# Patient Record
Sex: Male | Born: 1990 | Race: White | Hispanic: No | Marital: Single
Health system: Southern US, Community
[De-identification: ages and names within clinical notes are randomized; demographics above are authoritative.]

## PROBLEM LIST (undated history)

## (undated) DIAGNOSIS — J45909 Unspecified asthma, uncomplicated: Secondary | ICD-10-CM

## (undated) HISTORY — DX: Unspecified asthma, uncomplicated: J45.909

---

## 2021-05-31 ENCOUNTER — Emergency Department (HOSPITAL_COMMUNITY): Payer: Self-pay

## 2021-05-31 ENCOUNTER — Emergency Department (HOSPITAL_COMMUNITY)
Admission: EM | Admit: 2021-05-31 | Discharge: 2021-06-01 | Disposition: A | Discharge status: Home / Self Care | Payer: Self-pay | Attending: Student | Admitting: Student

## 2021-05-31 ENCOUNTER — Other Ambulatory Visit: Payer: Self-pay

## 2021-05-31 DIAGNOSIS — N5089 Other specified disorders of the male genital organs: Secondary | ICD-10-CM | POA: Insufficient documentation

## 2021-05-31 DIAGNOSIS — L03818 Cellulitis of other sites: Secondary | ICD-10-CM

## 2021-05-31 DIAGNOSIS — I861 Scrotal varices: Secondary | ICD-10-CM

## 2021-05-31 LAB — CBC WITH DIFFERENTIAL/PLATELET
Abs Immature Granulocytes: 0.05 10*3/uL (ref 0.00–0.07)
Basophils Absolute: 0.1 10*3/uL (ref 0.0–0.1)
Basophils Relative: 1 %
Eosinophils Absolute: 0.2 10*3/uL (ref 0.0–0.5)
Eosinophils Relative: 2 %
HCT: 44.9 % (ref 39.0–52.0)
Hemoglobin: 15.3 g/dL (ref 13.0–17.0)
Immature Granulocytes: 0 %
Lymphocytes Relative: 14 %
Lymphs Abs: 1.7 10*3/uL (ref 0.7–4.0)
MCH: 30 pg (ref 26.0–34.0)
MCHC: 34.1 g/dL (ref 30.0–36.0)
MCV: 88 fL (ref 80.0–100.0)
Monocytes Absolute: 1 10*3/uL (ref 0.1–1.0)
Monocytes Relative: 9 %
Neutro Abs: 9 10*3/uL — ABNORMAL HIGH (ref 1.7–7.7)
Neutrophils Relative %: 74 %
Platelets: 257 10*3/uL (ref 150–400)
RBC: 5.1 MIL/uL (ref 4.22–5.81)
RDW: 12.8 % (ref 11.5–15.5)
WBC: 12 10*3/uL — ABNORMAL HIGH (ref 4.0–10.5)
nRBC: 0 % (ref 0.0–0.2)

## 2021-05-31 LAB — URINALYSIS, ROUTINE W REFLEX MICROSCOPIC
Bilirubin Urine: NEGATIVE
Glucose, UA: NEGATIVE mg/dL
Hgb urine dipstick: NEGATIVE
Ketones, ur: NEGATIVE mg/dL
Leukocytes,Ua: NEGATIVE
Nitrite: NEGATIVE
Protein, ur: NEGATIVE mg/dL
Specific Gravity, Urine: 1.024 (ref 1.005–1.030)
pH: 5 (ref 5.0–8.0)

## 2021-05-31 LAB — COMPREHENSIVE METABOLIC PANEL
ALT: 78 U/L — ABNORMAL HIGH (ref 0–44)
AST: 45 U/L — ABNORMAL HIGH (ref 15–41)
Albumin: 4.6 g/dL (ref 3.5–5.0)
Alkaline Phosphatase: 56 U/L (ref 38–126)
Anion gap: 8 (ref 5–15)
BUN: 15 mg/dL (ref 6–20)
CO2: 25 mmol/L (ref 22–32)
Calcium: 9.3 mg/dL (ref 8.9–10.3)
Chloride: 101 mmol/L (ref 98–111)
Creatinine, Ser: 0.97 mg/dL (ref 0.61–1.24)
GFR, Estimated: >60 mL/min (ref >60)
Glucose, Bld: 106 mg/dL — ABNORMAL HIGH (ref 70–99)
Potassium: 3.6 mmol/L (ref 3.5–5.1)
Sodium: 134 mmol/L — ABNORMAL LOW (ref 135–145)
Total Bilirubin: 0.7 mg/dL (ref 0.3–1.2)
Total Protein: 7.8 g/dL (ref 6.5–8.1)

## 2021-05-31 LAB — LACTIC ACID, PLASMA: Lactic Acid, Venous: 0.8 mmol/L (ref 0.5–1.9)

## 2021-05-31 MED ORDER — ACETAMINOPHEN 500 MG PO TABS
1000.0000 mg | ORAL_TABLET | Freq: Once | ORAL | Status: AC
Start: 2021-05-31 — End: 2021-05-31
  Administered 2021-05-31: 1000 mg via ORAL
  Filled 2021-05-31: qty 2

## 2021-05-31 NOTE — ED Triage Notes (Signed)
Pt reports testicular swelling, progressing in size and pain throughout the day. Started out as what he thought was a bug bite and is now "like a third ball." Denies dysuria or drainage. Pt in drug rehab and does not want to take narcotics.

## 2021-05-31 NOTE — ED Provider Triage Note (Signed)
Emergency Medicine Provider Triage Evaluation Note  Jeff Crawford , a 31 y.o. male  was evaluated in triage.  Pt complains of scrotal swelling.  Noticed this earlier today, progressively worsening throughout the day.  States he has had a little bit of trouble urinating.  He denies any dysuria or penile discharge.  Has not been sexually active recently.  No trauma to the groin.  Review of Systems  Positive: Scrotal swelling Negative: Penile discharge  Physical Exam  BP (!) 146/94 (BP Location: Right Arm)    Pulse (!) 119    Temp (!) 100.5 F (38.1 C) (Oral)    Resp 18    Ht 5\' 10"  (1.778 m)    Wt 102.1 kg    SpO2 98%    BMI 32.28 kg/m  Gen:   Awake, no distress   Resp:  Normal effort  MSK:   Moves extremities without difficulty  Other:  Genital exam deferred in triage  Medical Decision Making  Medically screening exam initiated at 10:43 PM.  Appropriate orders placed.  Velta Addison was informed that the remainder of the evaluation will be completed by another provider, this initial triage assessment does not replace that evaluation, and the importance of remaining in the ED until their evaluation is complete.  Scrotal swelling.  Febrile and tachycardic in triage.  Will obtain labs, lactate, scrotal US w/doppler.   Larene Pickett, PA-C 05/31/21 2246

## 2021-06-01 MED ORDER — CEFADROXIL 500 MG PO CAPS
500.0000 mg | ORAL_CAPSULE | Freq: Two times a day (BID) | ORAL | Status: DC
  Administered 2021-06-01: 500 mg via ORAL
  Filled 2021-06-01: qty 1

## 2021-06-01 MED ORDER — NAPROXEN 375 MG PO TABS: 375.0000 mg | ORAL_TABLET | Freq: Two times a day (BID) | ORAL | 0 refills | Status: DC

## 2021-06-01 MED ORDER — CEFADROXIL 500 MG PO CAPS: 500.0000 mg | ORAL_CAPSULE | Freq: Two times a day (BID) | ORAL | 0 refills | Status: AC

## 2021-06-01 MED ORDER — SULFAMETHOXAZOLE-TRIMETHOPRIM 800-160 MG PO TABS: 1.0000 | ORAL_TABLET | Freq: Two times a day (BID) | ORAL | 0 refills | Status: AC

## 2021-06-01 MED ORDER — SULFAMETHOXAZOLE-TRIMETHOPRIM 800-160 MG PO TABS
1.0000 | ORAL_TABLET | Freq: Once | ORAL | Status: AC
  Administered 2021-06-01: 1 via ORAL
  Filled 2021-06-01: qty 1

## 2021-06-01 MED ORDER — NAPROXEN 250 MG PO TABS
375.0000 mg | ORAL_TABLET | Freq: Once | ORAL | Status: AC
  Administered 2021-06-01: 375 mg via ORAL
  Filled 2021-06-01: qty 2

## 2021-06-01 NOTE — ED Provider Notes (Signed)
[facility] EMERGENCY DEPARTMENT Provider Note  CSN: [id number] Arrival date & time: 05/31/21 2233  Chief Complaint(s) Groin Swelling  HPI Jeff Crawford is a 31 y.o. male with history of substance abuse currently in drug rehab who presents emergency department for evaluation of scrotal pain, swelling and fever.  Patient states that for the last 3 days he has had progressively worsening left testicular pain that radiates up into the abdomen and redness over the scrotum.  He denies any sexual activity, no use of IV drugs in the scrotum, no known trauma to the scrotum.  He states that he does work in the yard frequently and may have gotten bit by something but he is unsure.  He states that last night he had a fever with associated chills.  Denies chest pain, shortness of breath, abdominal pain, nausea, vomiting or other systemic symptoms.  HPI  Past Medical History No past medical history on file. There are no problems to display for this patient.  Home Medication(s) Prior to Admission medications   Not on File                                                                                                                                    Past Surgical History  Family History No family history on file.  Social History   Allergies Patient has no known allergies.  Review of Systems Review of Systems  Genitourinary:  Positive for scrotal swelling and testicular pain.   Physical Exam Vital Signs  I have reviewed the triage vital signs BP 123/72 (BP Location: Left Arm)    Pulse 80    Temp 98.3 F (36.8 C) (Oral)    Resp 17    Ht 5\' 10"  (1.778 m)    Wt 102.1 kg    SpO2 96%    BMI 32.28 kg/m   Physical Exam Vitals and nursing note reviewed.  Constitutional:      General: He is not in acute distress.    Appearance: He is well-developed.  HENT:     Head: Normocephalic and atraumatic.  Eyes:     Conjunctiva/sclera: Conjunctivae normal.  Cardiovascular:     Rate  and Rhythm: Normal rate and regular rhythm.     Heart sounds: No murmur heard. Pulmonary:     Effort: Pulmonary effort is normal. No respiratory distress.     Breath sounds: Normal breath sounds.  Abdominal:     Palpations: Abdomen is soft.     Tenderness: There is no abdominal tenderness.  Genitourinary:    Comments: Sick centimeter area of erythema over the left scrotum, mild pain with testicular elevation, no lymphadenopathy in the perineum Musculoskeletal:        General: No swelling.     Cervical back: Neck supple.  Skin:    General: Skin is warm and dry.     Capillary Refill: Capillary refill takes less than 2 seconds.  Neurological:     Mental Status: He is alert.  Psychiatric:        Mood and Affect: Mood normal.    ED Results and Treatments Labs (all labs ordered are listed, but only abnormal results are displayed) Labs Reviewed  CBC WITH DIFFERENTIAL/PLATELET - Abnormal; Notable for the following components:      Result Value   WBC 12.0 (*)    Neutro Abs 9.0 (*)    All other components within normal limits  COMPREHENSIVE METABOLIC PANEL - Abnormal; Notable for the following components:   Sodium 134 (*)    Glucose, Bld 106 (*)    AST 45 (*)    ALT 78 (*)    All other components within normal limits  LACTIC ACID, PLASMA  URINALYSIS, ROUTINE W REFLEX MICROSCOPIC                                                                                                                          Radiology US SCROTUM W/DOPPLER  Result Date: 06/01/2021 CLINICAL DATA:  Scrotal swelling. EXAM: SCROTAL ULTRASOUND DOPPLER ULTRASOUND OF THE TESTICLES TECHNIQUE: Complete ultrasound examination of the testicles, epididymis, and other scrotal structures was performed. Color and spectral Doppler ultrasound were also utilized to evaluate blood flow to the testicles. COMPARISON:  None. FINDINGS: Right testicle Measurements: 5.0 x 2.2 x 3.8. No mass or microlithiasis visualized. Left testicle  Measurements: 3.9 x 2.6 x 2.7. No mass or microlithiasis visualized. Right epididymis:  Normal in size and appearance. Left epididymis:  Normal in size and appearance. Hydrocele: Moderate bilateral hydroceles with internal echogenicities, greater on the right than on the left. Varicocele:  Large left varicocele. Pulsed Doppler interrogation of both testes demonstrates normal low resistance arterial and venous waveforms bilaterally. In the region of concern below the base of the penis there is a hyperechoic region measuring 6.4 x 2.0 x 5.9 cm with internal vascularity. IMPRESSION: 1. No evidence of testicular torsion. 2. Moderate bilateral hydroceles with internal echogenicities. 3. Large left varicocele. 4. In the region of concern at the base of the penis, there is an ill-defined hypoechoic region with internal vascularity measuring 6.4 x 2.0 x 5.9 cm which is indeterminate and may be infectious or inflammatory. Short-term follow-up is recommended to document resolution and exclude the possibility of underlying neoplasm. Electronically Signed   By: Thornell Sartorius M.D.   On: 06/01/2021 00:11    Pertinent labs & imaging results that were available during my care of the patient were reviewed by me and considered in my medical decision making (see MDM for details).  Medications Ordered in ED Medications  naproxen (NAPROSYN) tablet 375 mg (has no administration in time range)  sulfamethoxazole-trimethoprim (BACTRIM DS) 800-160 MG per tablet 1 tablet (has no administration in time range)  cefadroxil (DURICEF) capsule 500 mg (has no administration in time range)  acetaminophen (TYLENOL) tablet 1,000 mg (1,000 mg Oral Given 05/31/21 2248)  Procedures Procedures  (including critical care time)  Medical Decision Making / ED Course   This patient presents to the ED for concern of  scrotal pain and swelling, this involves an extensive number of treatment options, and is a complaint that carries with it a high risk of complications and morbidity.  The differential diagnosis includes cellulitis, testicular abscess, testicular malignancy, STI, orchitis, epididymitis  MDM: Patient seen emergency department for evaluation of testicular pain and swelling as well as fever.  Physical exam with a 6 centimeter area of erythema over the left scrotum with mild tenderness to palpation.  Laboratory evaluation with a leukocytosis to 12.0, AST 45, ALT 78 but is otherwise unremarkable.  Urinalysis unremarkable.  Ultrasound of the scrotum reveals moderate bilateral hydroceles, large left varicocele, ill-defined hypoechoic region with internal vascularity measuring 6.4 x 2.0 x 5.9 that is indeterminate, infectious versus inflammatory.  Urology consulted for short-term follow-up.  Patient given naproxen, Duricef and Bactrim.  Urology states that they will call the patient to arrange follow-up.  Patient discharged on antibiotics and Naprosyn with suspected scrotal cellulitis.   Additional history obtained:  -External records from outside source obtained and reviewed including: Chart review including previous notes, labs, imaging, consultation notes   Lab Tests: -I ordered, reviewed, and interpreted labs.   The pertinent results include:   Labs Reviewed  CBC WITH DIFFERENTIAL/PLATELET - Abnormal; Notable for the following components:      Result Value   WBC 12.0 (*)    Neutro Abs 9.0 (*)    All other components within normal limits  COMPREHENSIVE METABOLIC PANEL - Abnormal; Notable for the following components:   Sodium 134 (*)    Glucose, Bld 106 (*)    AST 45 (*)    ALT 78 (*)    All other components within normal limits  LACTIC ACID, PLASMA  URINALYSIS, ROUTINE W REFLEX MICROSCOPIC      Imaging Studies ordered: I ordered imaging studies including US scrotum  I independently  visualized and interpreted imaging. I agree with the radiologist interpretation   Medicines ordered and prescription drug management: Meds ordered this encounter  Medications   acetaminophen (TYLENOL) tablet 1,000 mg   naproxen (NAPROSYN) tablet 375 mg   sulfamethoxazole-trimethoprim (BACTRIM DS) 800-160 MG per tablet 1 tablet   cefadroxil (DURICEF) capsule 500 mg    -I have reviewed the patients home medicines and have made adjustments as needed  Critical interventions none  Consultations Obtained: I requested consultation with the urology,  and discussed lab and imaging findings as well as pertinent plan - they recommend: outpatient fu with abx   Cardiac Monitoring: The patient was maintained on a cardiac monitor.  I personally viewed and interpreted the cardiac monitored which showed an underlying rhythm of: NSr  Social Determinants of Health:  Factors impacting patients care include: Currently in drug rehab   Reevaluation: After the interventions noted above, I reevaluated the patient and found that they have :improved  Co morbidities that complicate the patient evaluation No past medical history on file.    Dispostion: I considered admission for this patient, but his scrotal cellulitis can be managed outside the hospital and he is safe for discharge.     Final Clinical Impression(s) / ED Diagnoses Final diagnoses:  Scrotal swelling     @PCDICTATION @    Lalisa Kiehn, , MD 06/01/21 1012

## 2021-06-01 NOTE — ED Notes (Signed)
ED Provider at bedside. 

## 2021-06-28 ENCOUNTER — Encounter (HOSPITAL_COMMUNITY): Payer: Self-pay | Admitting: Radiology

## 2021-10-20 ENCOUNTER — Emergency Department (HOSPITAL_COMMUNITY): Payer: Self-pay

## 2021-10-20 ENCOUNTER — Encounter (HOSPITAL_COMMUNITY): Payer: Self-pay | Admitting: Emergency Medicine

## 2021-10-20 ENCOUNTER — Emergency Department (HOSPITAL_COMMUNITY)
Admission: EM | Admit: 2021-10-20 | Discharge: 2021-10-20 | Disposition: A | Discharge status: Home / Self Care | Payer: Self-pay | Attending: Emergency Medicine | Admitting: Emergency Medicine

## 2021-10-20 DIAGNOSIS — S20314A Abrasion of middle front wall of thorax, initial encounter: Secondary | ICD-10-CM | POA: Insufficient documentation

## 2021-10-20 DIAGNOSIS — Y9389 Activity, other specified: Secondary | ICD-10-CM | POA: Insufficient documentation

## 2021-10-20 DIAGNOSIS — X500XXA Overexertion from strenuous movement or load, initial encounter: Secondary | ICD-10-CM | POA: Insufficient documentation

## 2021-10-20 DIAGNOSIS — S299XXA Unspecified injury of thorax, initial encounter: Secondary | ICD-10-CM

## 2021-10-20 DIAGNOSIS — R001 Bradycardia, unspecified: Secondary | ICD-10-CM | POA: Insufficient documentation

## 2021-10-20 LAB — CBC WITH DIFFERENTIAL/PLATELET
Abs Immature Granulocytes: 0.02 10*3/uL (ref 0.00–0.07)
Basophils Absolute: 0.1 10*3/uL (ref 0.0–0.1)
Basophils Relative: 1 %
Eosinophils Absolute: 0.2 10*3/uL (ref 0.0–0.5)
Eosinophils Relative: 3 %
HCT: 45.5 % (ref 39.0–52.0)
Hemoglobin: 15.9 g/dL (ref 13.0–17.0)
Immature Granulocytes: 0 %
Lymphocytes Relative: 25 %
Lymphs Abs: 1.6 10*3/uL (ref 0.7–4.0)
MCH: 30.2 pg (ref 26.0–34.0)
MCHC: 34.9 g/dL (ref 30.0–36.0)
MCV: 86.5 fL (ref 80.0–100.0)
Monocytes Absolute: 0.6 10*3/uL (ref 0.1–1.0)
Monocytes Relative: 9 %
Neutro Abs: 4.1 10*3/uL (ref 1.7–7.7)
Neutrophils Relative %: 62 %
Platelets: 288 10*3/uL (ref 150–400)
RBC: 5.26 MIL/uL (ref 4.22–5.81)
RDW: 13 % (ref 11.5–15.5)
WBC: 6.6 10*3/uL (ref 4.0–10.5)
nRBC: 0 % (ref 0.0–0.2)

## 2021-10-20 LAB — BASIC METABOLIC PANEL
Anion gap: 8 (ref 5–15)
BUN: 20 mg/dL (ref 6–20)
CO2: 24 mmol/L (ref 22–32)
Calcium: 9.6 mg/dL (ref 8.9–10.3)
Chloride: 105 mmol/L (ref 98–111)
Creatinine, Ser: 1.02 mg/dL (ref 0.61–1.24)
GFR, Estimated: >60 mL/min (ref >60)
Glucose, Bld: 100 mg/dL — ABNORMAL HIGH (ref 70–99)
Potassium: 4.1 mmol/L (ref 3.5–5.1)
Sodium: 137 mmol/L (ref 135–145)

## 2021-10-20 LAB — CK: Total CK: 239 U/L (ref 49–397)

## 2021-10-20 MED ORDER — OXYCODONE HCL 5 MG PO TABS
5.0000 mg | ORAL_TABLET | Freq: Once | ORAL | Status: AC
  Administered 2021-10-20: 5 mg via ORAL
  Filled 2021-10-20: qty 1

## 2021-10-20 MED ORDER — NAPROXEN 500 MG PO TABS: 500.0000 mg | ORAL_TABLET | Freq: Two times a day (BID) | ORAL | 0 refills | Status: AC

## 2021-10-20 MED ORDER — LIDOCAINE 5 % EX PTCH: 1.0000 | MEDICATED_PATCH | CUTANEOUS | 0 refills | Status: AC

## 2021-10-20 MED ORDER — METHOCARBAMOL 500 MG PO TABS: 500.0000 mg | ORAL_TABLET | Freq: Two times a day (BID) | ORAL | 0 refills | Status: AC

## 2021-10-20 MED ORDER — ACETAMINOPHEN 325 MG PO TABS
650.0000 mg | ORAL_TABLET | Freq: Once | ORAL | Status: AC
  Administered 2021-10-20: 650 mg via ORAL
  Filled 2021-10-20: qty 2

## 2021-10-20 MED ORDER — IOHEXOL 300 MG/ML  SOLN
75.0000 mL | Freq: Once | INTRAMUSCULAR | Status: AC | PRN
  Administered 2021-10-20: 75 mL via INTRAVENOUS

## 2021-10-20 MED ORDER — ACETAMINOPHEN 500 MG PO TABS
1000.0000 mg | ORAL_TABLET | Freq: Once | ORAL | Status: AC
  Administered 2021-10-20: 1000 mg via ORAL
  Filled 2021-10-20: qty 2

## 2021-10-20 MED ORDER — METHOCARBAMOL 500 MG PO TABS
500.0000 mg | ORAL_TABLET | Freq: Once | ORAL | Status: AC
  Administered 2021-10-20: 500 mg via ORAL
  Filled 2021-10-20: qty 1

## 2021-10-20 NOTE — ED Provider Triage Note (Signed)
Emergency Medicine Provider Triage Evaluation Note  Jeff Crawford , a 31 y.o. male  was evaluated in triage.  Pt complains of chest pain.  This happened while moving a refrigerator unit out of the vehicle, refrigerating unit and gel he slammed the patient against a wall.  Having difficulty breathing and chest wall pain since then.  Not on blood thinners..  Review of Systems  Positive: Chest pain Negative: LOC, headache, abdominal pain  Physical Exam  There were no vitals taken for this visit. Gen:   Awake, no distress   Resp:  Normal effort  MSK:   Moves extremities without difficulty  Other:  abrasion to middle of chest wall.  Decreased effort of breathing secondary to pain.  Chest wall tenderness  Medical Decision Making  Medically screening exam initiated at 11:08 AM.  Appropriate orders placed.  Tawni Pummel was informed that the remainder of the evaluation will be completed by another provider, this initial triage assessment does not replace that evaluation, and the importance of remaining in the ED until their evaluation is complete.  Chest x-ray to start, labs in case CT indicated   Theron Arista, Cordelia Poche 10/20/21 1109

## 2021-10-20 NOTE — ED Provider Notes (Signed)
[facility] EMERGENCY DEPARTMENT Provider Note   CSN: [id number] Arrival date & time: 10/20/21  1027     History  Injury   Jeff Crawford is a 31 y.o. male here for evaluation of chest wall injury.  Patient works for a [employer].  States he was walking quickly and did not slow the end of the ramp when he was moving a refrigerator causing it to compress him between a brick wall. Denies head injury. No LOC, anticoagulation. Persistent pain to chest wall. No abd pain, emesis. Difficult to raise arms due to chest wall pain however denies over arm injurt. Some midline thoracic back pain as well. Worse with movement. No numbness, weakness.  HPI     Home Medications Prior to Admission medications   Medication Sig Start Date End Date Taking? Authorizing Provider  lidocaine (LIDODERM) 5 % Place 1 patch onto the skin daily. Remove & Discard patch within 12 hours or as directed by MD 10/20/21  Yes Kenli Waldo A, PA-C  methocarbamol (ROBAXIN) 500 MG tablet Take 1 tablet (500 mg total) by mouth 2 (two) times daily. 10/20/21  Yes Brandell Maready A, PA-C  naproxen (NAPROSYN) 500 MG tablet Take 1 tablet (500 mg total) by mouth 2 (two) times daily. 10/20/21  Yes Brenyn Petrey A, PA-C      Allergies    Pineapple    Review of Systems   Review of Systems  Constitutional: Negative.   HENT: Negative.    Respiratory: Negative.    Cardiovascular:  Positive for chest pain (chest wall pain).  Gastrointestinal: Negative.   Genitourinary: Negative.   Musculoskeletal: Negative.   Skin: Negative.   Neurological: Negative.   All other systems reviewed and are negative.   Physical Exam Updated Vital Signs BP 120/84 (BP Location: Left Arm)   Pulse (!) 57   Temp 98.3 F (36.8 C) (Oral)   Resp 15   SpO2 100%  Physical Exam Vitals and nursing note reviewed.  Constitutional:      General: He is not in acute distress.    Appearance: He is well-developed. He is not  ill-appearing, toxic-appearing or diaphoretic.  HENT:     Head: Normocephalic and atraumatic.     Mouth/Throat:     Mouth: Mucous membranes are moist.  Eyes:     Pupils: Pupils are equal, round, and reactive to light.  Cardiovascular:     Rate and Rhythm: Normal rate and regular rhythm.     Pulses: Normal pulses.  Pulmonary:     Effort: Pulmonary effort is normal. No respiratory distress.     Breath sounds: Normal breath sounds.  Chest:       Comments: Diffuse mid and lower chest wall tenderness. Overlying small superficial abrasion Abdominal:     General: Bowel sounds are normal. There is no distension.     Palpations: Abdomen is soft.     Tenderness: There is no abdominal tenderness. There is no right CVA tenderness, left CVA tenderness, guarding or rebound.     Comments: Soft non tender, no bruising, ecchymosis, abrasion.  Musculoskeletal:        General: Normal range of motion.     Cervical back: Normal range of motion and neck supple.     Comments: Tenderness bilateral upper extremities.  Mild tenderness thoracic region, nontender cervical, lumbar spine.  Nontender bilateral lower extremities, pelvis.  Skin:    General: Skin is warm and dry.  Neurological:     General: No focal deficit  present.     Mental Status: He is alert and oriented to person, place, and time.     Cranial Nerves: Cranial nerves 2-12 are intact.     Sensory: Sensation is intact.     Motor: Motor function is intact.     Gait: Gait is intact.     ED Results / Procedures / Treatments   Labs (all labs ordered are listed, but only abnormal results are displayed) Labs Reviewed  BASIC METABOLIC PANEL - Abnormal; Notable for the following components:      Result Value   Glucose, Bld 100 (*)    All other components within normal limits  CBC WITH DIFFERENTIAL/PLATELET  CK    EKG None  Radiology DG Thoracic Spine 2 View  Result Date: 10/20/2021 CLINICAL DATA:  pinned against wall by fridge EXAM:  THORACIC SPINE 2 VIEWS COMPARISON:  Reformats from chest CT earlier today. FINDINGS: No acute fracture. The alignment is maintained. Vertebral body heights are maintained. No significant disc space narrowing. Posterior elements appear intact. There is no paravertebral soft tissue abnormality. IMPRESSION: Negative radiographs of the thoracic spine.  No fracture. Electronically Signed   By: Narda Rutherford M.D.   On: 10/20/2021 18:57   CT Chest W Contrast  Result Date: 10/20/2021 CLINICAL DATA:  Chest trauma, blunt EXAM: CT CHEST WITH CONTRAST TECHNIQUE: Multidetector CT imaging of the chest was performed during intravenous contrast administration. RADIATION DOSE REDUCTION: This exam was performed according to the departmental dose-optimization program which includes automated exposure control, adjustment of the mA and/or kV according to patient size and/or use of iterative reconstruction technique. CONTRAST:  59mL OMNIPAQUE IOHEXOL 300 MG/ML  SOLN COMPARISON:  Same day chest radiograph. FINDINGS: Cardiovascular: No significant vascular findings. Normal heart size. No pericardial effusion. Mediastinum/Nodes: No enlarged mediastinal, hilar, or axillary lymph nodes. Thyroid gland, trachea, and esophagus demonstrate no significant findings. Small amount of residual thymus in the anterior mediastinum. Lungs/Pleura: Lungs are clear. No pleural effusion or pneumothorax. Upper Abdomen: No acute abnormality. Musculoskeletal: No fracture is seen. IMPRESSION: No evidence of acute abnormality. Electronically Signed   By: Feliberto Harts M.D.   On: 10/20/2021 14:49   DG Chest 2 View  Result Date: 10/20/2021 CLINICAL DATA:  Chest pain, trauma wall moving a refrigerator. EXAM: CHEST - 2 VIEW COMPARISON:  None Available. FINDINGS: No pneumothorax or airspace opacity to suggest pulmonary contusion. No blunting of the costophrenic angles. Cardiac and mediastinal margins appear normal. The lungs appear clear. No displaced or  well-defined fracture is observed. IMPRESSION: 1. No significant radiographic findings. Electronically Signed   By: Gaylyn Rong M.D.   On: 10/20/2021 11:47    Procedures Procedures    Medications Ordered in ED Medications  acetaminophen (TYLENOL) tablet 1,000 mg (1,000 mg Oral Given 10/20/21 1134)  iohexol (OMNIPAQUE) 300 MG/ML solution 75 mL (75 mLs Intravenous Contrast Given 10/20/21 1443)  acetaminophen (TYLENOL) tablet 650 mg (650 mg Oral Given 10/20/21 1746)  oxyCODONE (Oxy IR/ROXICODONE) immediate release tablet 5 mg (5 mg Oral Given 10/20/21 1943)  methocarbamol (ROBAXIN) tablet 500 mg (500 mg Oral Given 10/20/21 1942)   ED Course/ Medical Decision Making/ A&P    31 year old here for evaluation of chest wall pain after being pinned between refrigerator and brick wall.  He has diffuse tenderness to his mid anterior chest wall and some mild tenderness to his midline thoracic region.  He is nonfocal neuro exam without deficits.  Pain worse with movement.  Does have abrasion to anterior chest wall  have no crepitus, step-off.  His abdomen is soft, nontender.  Admits to diffuse aching to his bilateral upper extremities however denies any recent traumatic injury.  Did not have pain prior to injury.  He did not fall to the ground.  Imaging personally viewed and interpreted:  CBC without leukocytosis BMP without significant abnormality Chest xray without fracture, cardiomegaly, pulm edema, pneumothorax CT chest without significant abnormality Xray thoracic without fracture CK 239  Patient reassessed.  Discussed labs and imaging.  Likely soft tissue/MSK pain.  He has no evidence of acute intrathoracic/intra-abdominal injury.  DC home with symptomatic management.  The patient has been appropriately medically screened and/or stabilized in the ED. I have low suspicion for any other emergent medical condition which would require further screening, evaluation or treatment in the ED or  require inpatient management.  Patient is hemodynamically stable and in no acute distress.  Patient able to ambulate in department prior to ED.  Evaluation does not show acute pathology that would require ongoing or additional emergent interventions while in the emergency department or further inpatient treatment.  I have discussed the diagnosis with the patient and answered all questions.  Pain is been managed while in the emergency department and patient has no further complaints prior to discharge.  Patient is comfortable with plan discussed in room and is stable for discharge at this time.  I have discussed strict return precautions for returning to the emergency department.  Patient was encouraged to follow-up with PCP/specialist refer to at discharge.                             Medical Decision Making Amount and/or Complexity of Data Reviewed Independent Historian: friend External Data Reviewed: labs, radiology, ECG and notes. Labs: ordered. Decision-making details documented in ED Course. Radiology: ordered and independent interpretation performed. Decision-making details documented in ED Course.  Risk OTC drugs. Prescription drug management.           Final Clinical Impression(s) / ED Diagnoses Final diagnoses:  Injury of chest wall, initial encounter    Rx / DC Orders ED Discharge Orders          Ordered    naproxen (NAPROSYN) 500 MG tablet  2 times daily        10/20/21 2152    methocarbamol (ROBAXIN) 500 MG tablet  2 times daily        10/20/21 2152    lidocaine (LIDODERM) 5 %  Every 24 hours        10/20/21 2152              Aolanis Crispen A, PA-C 10/20/21 2210    Gloris Manchester, MD 10/23/21 0745

## 2021-10-20 NOTE — ED Triage Notes (Signed)
Patient here for evaluation after a refrigeration unit fell, hitting him in the chest, and pinning him in place at approximately 1000. Patietn complains of chest pain.

## 2021-10-20 NOTE — Discharge Instructions (Addendum)
Take medications as prescribed.  Return for new or worsening symptoms.  I have provided you with a work note.

## 2023-06-24 IMAGING — US US SCROTUM W/ DOPPLER COMPLETE
1 series · 13 of 25 positions shown · non-contrast
Comparison: None.

CLINICAL DATA: Scrotal swelling.

EXAM:
SCROTAL ULTRASOUND
DOPPLER ULTRASOUND OF THE TESTICLES
TECHNIQUE: Complete ultrasound examination of the testicles, epididymis, and
other scrotal structures was performed. Color and spectral Doppler
ultrasound were also utilized to evaluate blood flow to the
testicles.

[Series 1: us scrotum w/doppler · 64 acquisitions, 13 frames shown]
[im 1/64]
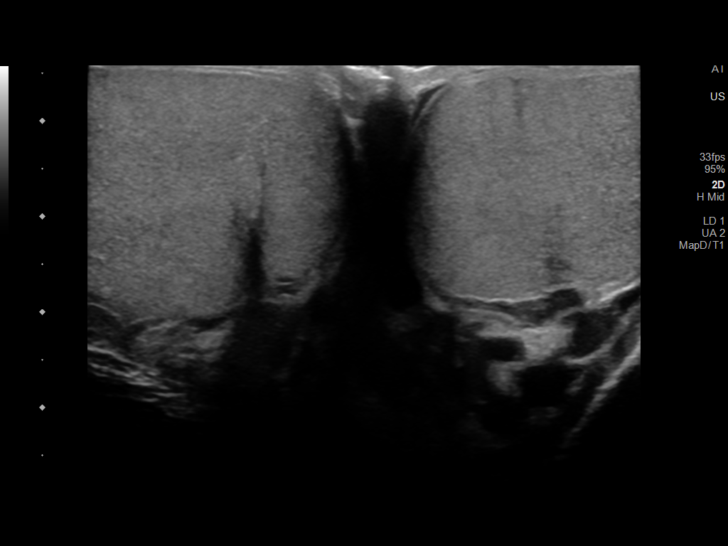
[im 6/64]
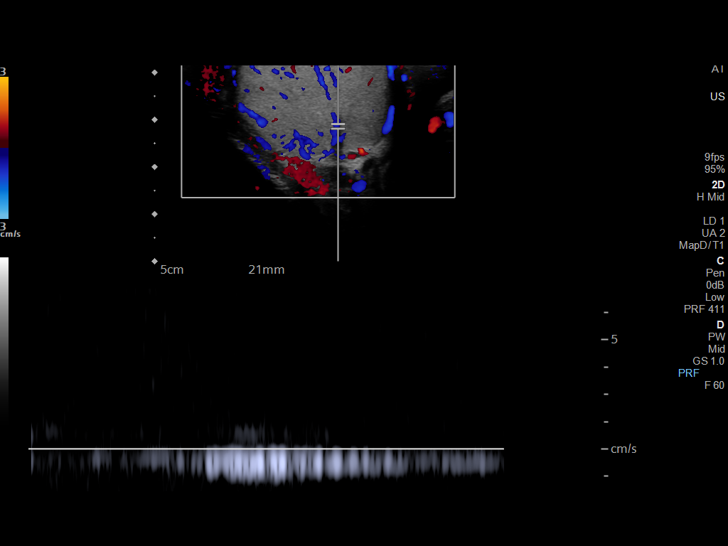
[im 11/64]
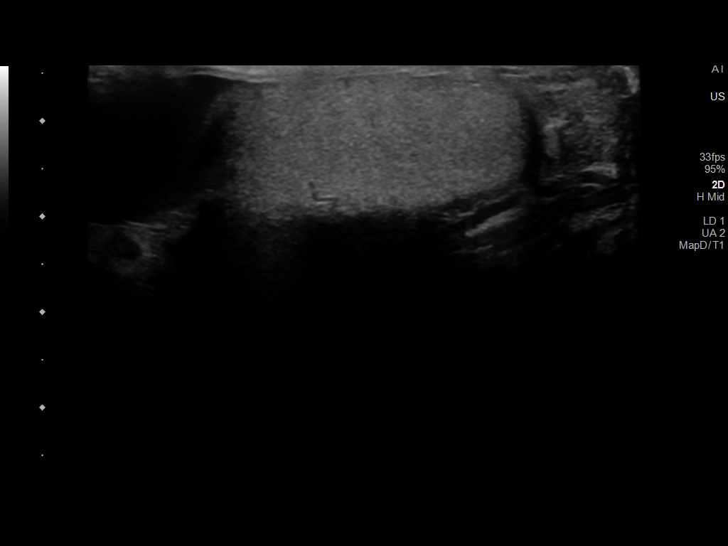
[im 16/64]
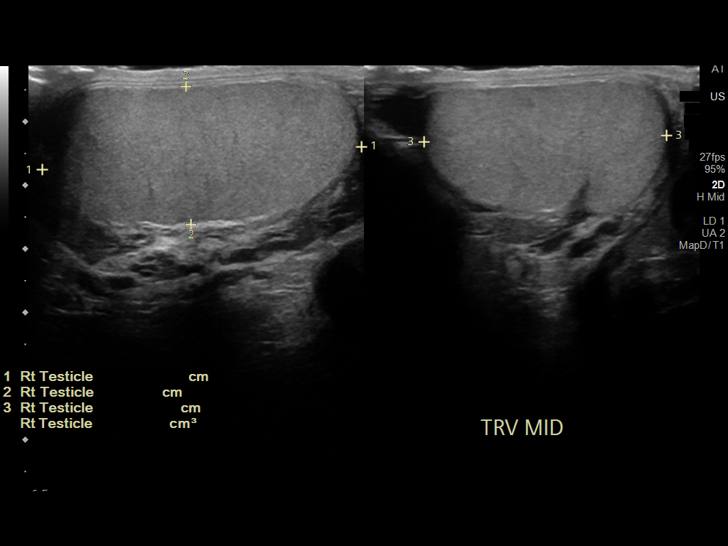
[im 22/64]
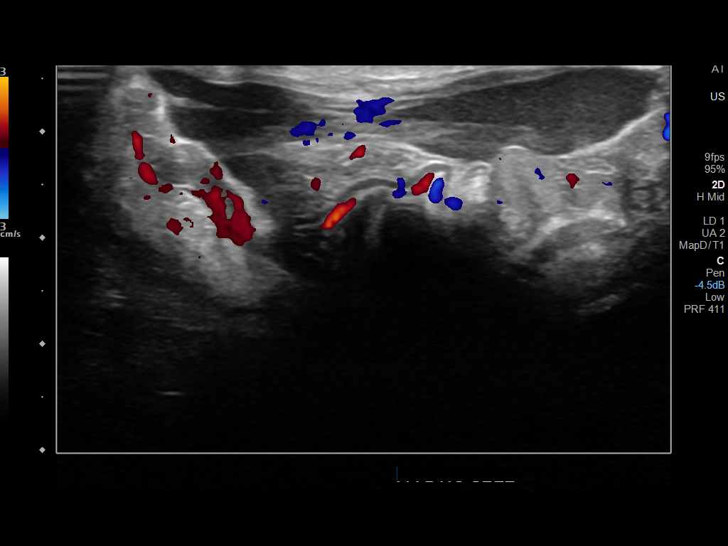
[im 27/64]
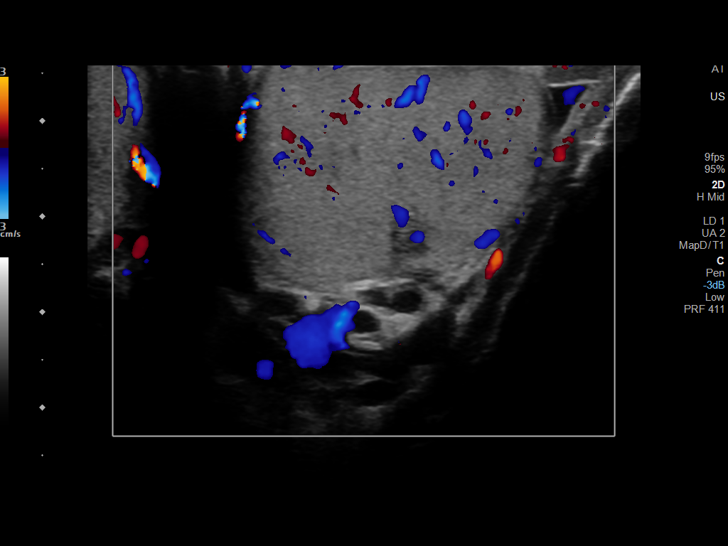
[im 32/64]
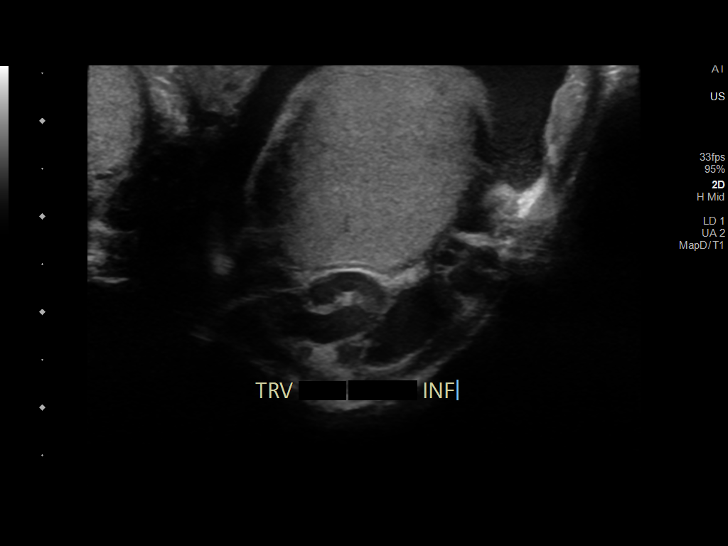
[im 37/64]
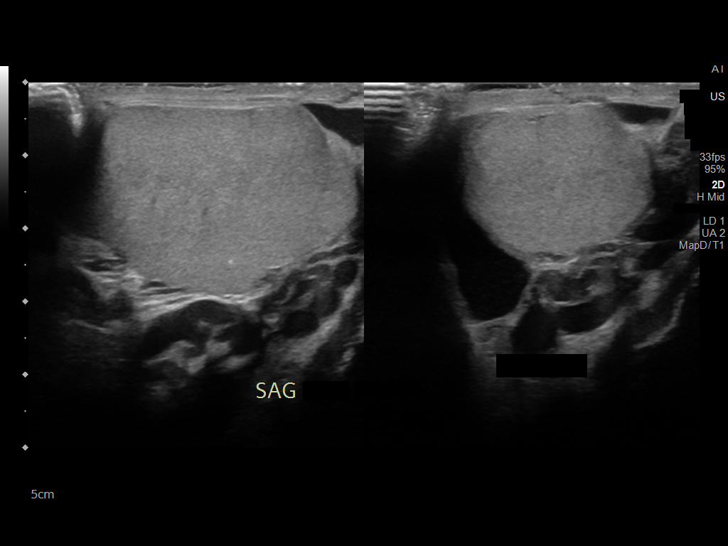
[im 43/64]
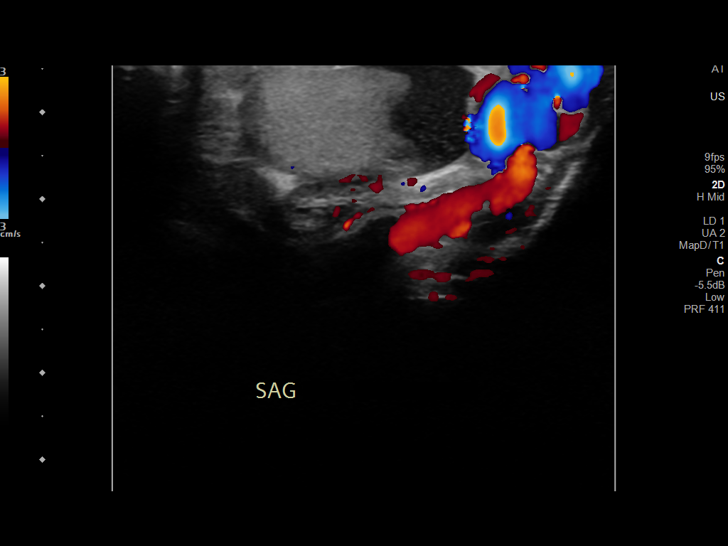
[im 48/64]
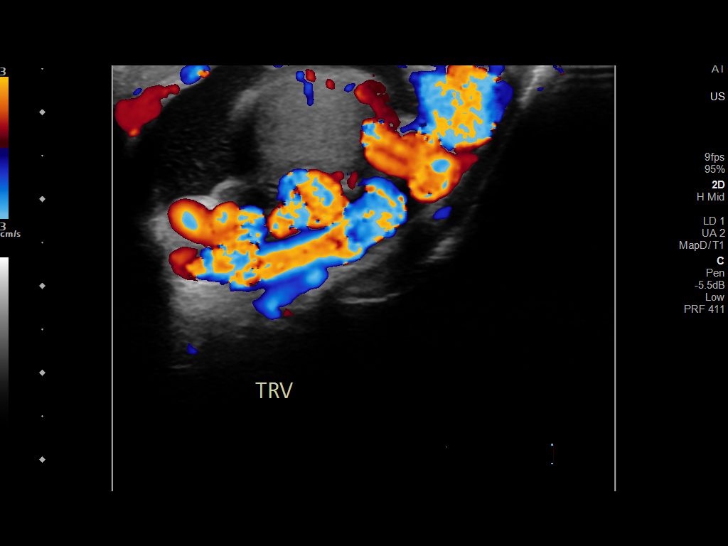
[im 53/64]
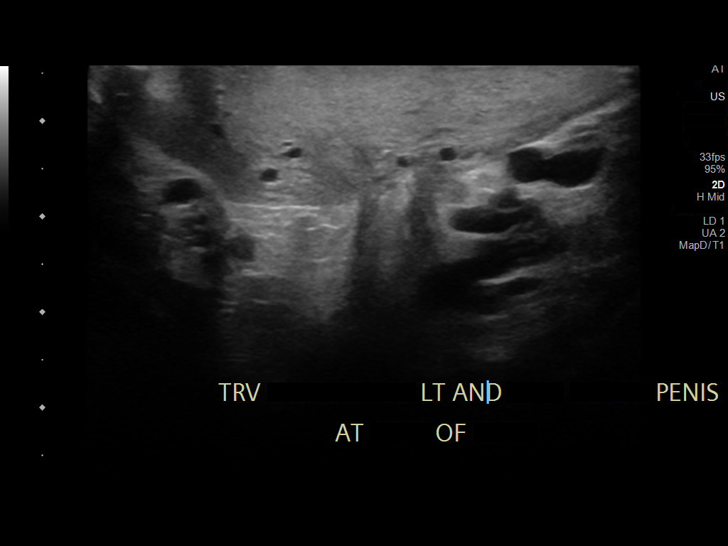
[im 58/64]
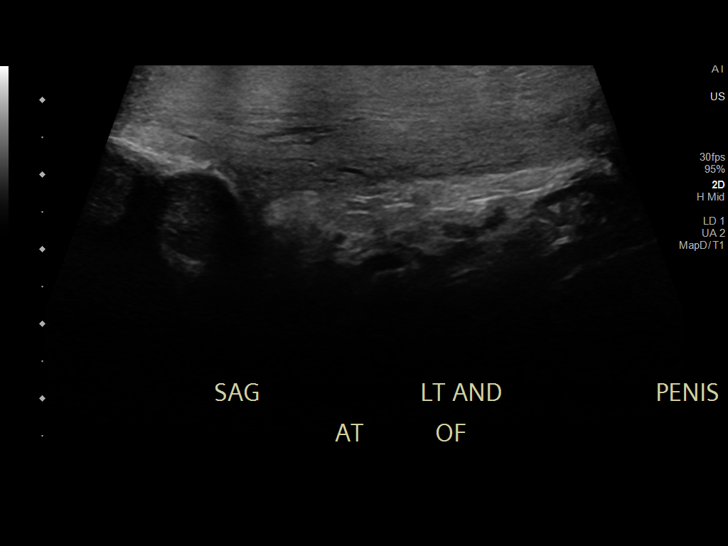
[im 64/64]
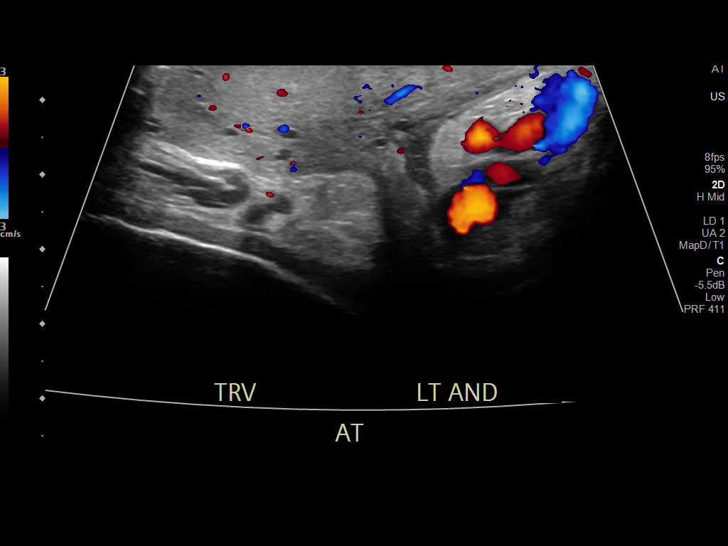

[13 of 25 positions shown; findings below may reference images not displayed]

FINDINGS: Right testicle

Measurements: 5.0 x 2.2 x 3.8. No mass or microlithiasis visualized.

Left testicle

Measurements: 3.9 x 2.6 x 2.7. No mass or microlithiasis visualized.

Right epididymis:  Normal in size and appearance.

Left epididymis:  Normal in size and appearance.

Hydrocele: Moderate bilateral hydroceles with internal
echogenicities, greater on the right than on the left.

Varicocele:  Large left varicocele.

Pulsed Doppler interrogation of both testes demonstrates normal low
resistance arterial and venous waveforms bilaterally.

In the region of concern below the base of the penis there is a
hyperechoic region measuring 6.4 x 2.0 x 5.9 cm with internal
vascularity.
IMPRESSION: 1. No evidence of testicular torsion.
2. Moderate bilateral hydroceles with internal echogenicities.
3. Large left varicocele.
4. In the region of concern at the base of the penis, there is an
ill-defined hypoechoic region with internal vascularity measuring
6.4 x 2.0 x 5.9 cm which is indeterminate and may be infectious or
inflammatory. Short-term follow-up is recommended to document
resolution and exclude the possibility of underlying neoplasm.
